# Patient Record
Sex: Male | Born: 1935 | Race: White | Hispanic: No | Marital: Married | Smoking: Never smoker
Health system: Southern US, Community
[De-identification: ages and names within clinical notes are randomized; demographics above are authoritative.]

## PROBLEM LIST (undated history)

## (undated) DIAGNOSIS — E079 Disorder of thyroid, unspecified: Secondary | ICD-10-CM

## (undated) DIAGNOSIS — R0989 Other specified symptoms and signs involving the circulatory and respiratory systems: Secondary | ICD-10-CM

## (undated) DIAGNOSIS — K219 Gastro-esophageal reflux disease without esophagitis: Secondary | ICD-10-CM

## (undated) DIAGNOSIS — R609 Edema, unspecified: Secondary | ICD-10-CM

## (undated) DIAGNOSIS — F419 Anxiety disorder, unspecified: Secondary | ICD-10-CM

## (undated) DIAGNOSIS — C801 Malignant (primary) neoplasm, unspecified: Secondary | ICD-10-CM

## (undated) DIAGNOSIS — R413 Other amnesia: Secondary | ICD-10-CM

## (undated) DIAGNOSIS — R21 Rash and other nonspecific skin eruption: Secondary | ICD-10-CM

## (undated) DIAGNOSIS — I10 Essential (primary) hypertension: Secondary | ICD-10-CM

## (undated) HISTORY — DX: Other specified symptoms and signs involving the circulatory and respiratory systems: R09.89

## (undated) HISTORY — PX: TONSILLECTOMY: SUR1361

## (undated) HISTORY — PX: PILONIDAL CYST EXCISION: SHX744

## (undated) HISTORY — DX: Other amnesia: R41.3

## (undated) HISTORY — DX: Malignant (primary) neoplasm, unspecified: C80.1

## (undated) HISTORY — DX: Edema, unspecified: R60.9

## (undated) HISTORY — DX: Anxiety disorder, unspecified: F41.9

## (undated) HISTORY — DX: Rash and other nonspecific skin eruption: R21

---

## 2002-05-08 ENCOUNTER — Encounter: Payer: Self-pay | Admitting: Neurological Surgery

## 2002-05-08 ENCOUNTER — Ambulatory Visit (HOSPITAL_COMMUNITY): Admission: RE | Admit: 2002-05-08 | Discharge: 2002-05-08 | Payer: Self-pay

## 2007-10-10 ENCOUNTER — Encounter (INDEPENDENT_AMBULATORY_CARE_PROVIDER_SITE_OTHER): Payer: Self-pay | Admitting: Interventional Radiology

## 2007-10-10 ENCOUNTER — Other Ambulatory Visit: Admission: RE | Admit: 2007-10-10 | Discharge: 2007-10-10 | Payer: Self-pay | Admitting: Interventional Radiology

## 2007-10-10 ENCOUNTER — Encounter: Admission: RE | Admit: 2007-10-10 | Discharge: 2007-10-10 | Payer: Self-pay | Admitting: Surgery

## 2009-09-21 ENCOUNTER — Encounter: Admission: RE | Admit: 2009-09-21 | Discharge: 2009-09-21 | Payer: Self-pay | Admitting: Surgery

## 2010-11-08 ENCOUNTER — Encounter
Admission: RE | Admit: 2010-11-08 | Discharge: 2010-11-08 | Payer: Self-pay | Source: Home / Self Care | Attending: Surgery | Admitting: Surgery

## 2012-11-18 ENCOUNTER — Other Ambulatory Visit (INDEPENDENT_AMBULATORY_CARE_PROVIDER_SITE_OTHER): Payer: Self-pay

## 2012-11-18 ENCOUNTER — Telehealth (INDEPENDENT_AMBULATORY_CARE_PROVIDER_SITE_OTHER): Payer: Self-pay

## 2012-11-18 DIAGNOSIS — E042 Nontoxic multinodular goiter: Secondary | ICD-10-CM

## 2012-11-18 NOTE — Telephone Encounter (Signed)
I returned call. Pt will set up u/s at [facility]. Order for u/s and labs placed in epic. Lab slip mailed to pt. Pts wife states pt was not able to have thyroid followed at [place]. They advised him they no longer follow thyroid issues and pt will need to be seen by thyroid specialist.

## 2012-11-27 ENCOUNTER — Other Ambulatory Visit (INDEPENDENT_AMBULATORY_CARE_PROVIDER_SITE_OTHER): Payer: Self-pay | Admitting: Surgery

## 2012-11-27 ENCOUNTER — Ambulatory Visit
Admission: RE | Admit: 2012-11-27 | Discharge: 2012-11-27 | Disposition: A | Discharge status: Home / Self Care | Payer: Medicare Other | Source: Ambulatory Visit | Attending: Surgery | Admitting: Surgery

## 2012-11-27 DIAGNOSIS — E042 Nontoxic multinodular goiter: Secondary | ICD-10-CM

## 2012-12-03 ENCOUNTER — Ambulatory Visit (INDEPENDENT_AMBULATORY_CARE_PROVIDER_SITE_OTHER): Payer: Self-pay | Admitting: Surgery

## 2012-12-23 ENCOUNTER — Emergency Department (HOSPITAL_COMMUNITY)
Admission: EM | Admit: 2012-12-23 | Discharge: 2012-12-23 | Disposition: A | Discharge status: Home / Self Care | Payer: Medicare Other | Attending: Emergency Medicine | Admitting: Emergency Medicine

## 2012-12-23 ENCOUNTER — Emergency Department (HOSPITAL_COMMUNITY): Payer: Medicare Other

## 2012-12-23 ENCOUNTER — Encounter (HOSPITAL_COMMUNITY): Payer: Self-pay | Admitting: Emergency Medicine

## 2012-12-23 DIAGNOSIS — Z7982 Long term (current) use of aspirin: Secondary | ICD-10-CM | POA: Insufficient documentation

## 2012-12-23 DIAGNOSIS — I10 Essential (primary) hypertension: Secondary | ICD-10-CM | POA: Insufficient documentation

## 2012-12-23 DIAGNOSIS — F068 Other specified mental disorders due to known physiological condition: Secondary | ICD-10-CM | POA: Insufficient documentation

## 2012-12-23 DIAGNOSIS — N200 Calculus of kidney: Secondary | ICD-10-CM | POA: Insufficient documentation

## 2012-12-23 DIAGNOSIS — E079 Disorder of thyroid, unspecified: Secondary | ICD-10-CM | POA: Insufficient documentation

## 2012-12-23 DIAGNOSIS — K219 Gastro-esophageal reflux disease without esophagitis: Secondary | ICD-10-CM | POA: Insufficient documentation

## 2012-12-23 DIAGNOSIS — Z79899 Other long term (current) drug therapy: Secondary | ICD-10-CM | POA: Insufficient documentation

## 2012-12-23 HISTORY — DX: Gastro-esophageal reflux disease without esophagitis: K21.9

## 2012-12-23 HISTORY — DX: Essential (primary) hypertension: I10

## 2012-12-23 HISTORY — DX: Disorder of thyroid, unspecified: E07.9

## 2012-12-23 LAB — CBC WITH DIFFERENTIAL/PLATELET
Basophils Absolute: 0 10*3/uL (ref 0.0–0.1)
Eosinophils Absolute: 0 10*3/uL (ref 0.0–0.7)
Eosinophils Relative: 0 % (ref 0–5)
MCH: 31.9 pg (ref 26.0–34.0)
MCHC: 34.6 g/dL (ref 30.0–36.0)
MCV: 92.2 fL (ref 78.0–100.0)
Platelets: 126 10*3/uL — ABNORMAL LOW (ref 150–400)
RDW: 12.3 % (ref 11.5–15.5)

## 2012-12-23 LAB — COMPREHENSIVE METABOLIC PANEL
ALT: 20 U/L (ref 0–53)
AST: 28 U/L (ref 0–37)
Calcium: 9.6 mg/dL (ref 8.4–10.5)
Creatinine, Ser: 1.17 mg/dL (ref 0.50–1.35)
GFR calc Af Amer: 63 mL/min — ABNORMAL LOW (ref >90)
Sodium: 140 mEq/L (ref 135–145)
Total Protein: 7.1 g/dL (ref 6.0–8.3)

## 2012-12-23 LAB — URINALYSIS, ROUTINE W REFLEX MICROSCOPIC
Bilirubin Urine: NEGATIVE
Hgb urine dipstick: NEGATIVE
Ketones, ur: NEGATIVE mg/dL
Protein, ur: NEGATIVE mg/dL
Urobilinogen, UA: 1 mg/dL (ref 0.0–1.0)

## 2012-12-23 LAB — LIPASE, BLOOD: Lipase: 20 U/L (ref 11–59)

## 2012-12-23 MED ORDER — HYDROCODONE-ACETAMINOPHEN 5-325 MG PO TABS: 1.0000 | ORAL_TABLET | Freq: Four times a day (QID) | ORAL | Status: DC | PRN

## 2012-12-23 MED ORDER — MORPHINE SULFATE 4 MG/ML IJ SOLN
4.0000 mg | Freq: Once | INTRAMUSCULAR | Status: AC
  Administered 2012-12-23: 4 mg via INTRAVENOUS
  Filled 2012-12-23: qty 1

## 2012-12-23 MED ORDER — IOHEXOL 300 MG/ML  SOLN
100.0000 mL | Freq: Once | INTRAMUSCULAR | Status: AC | PRN
  Administered 2012-12-23: 100 mL via INTRAVENOUS

## 2012-12-23 MED ORDER — IOHEXOL 300 MG/ML  SOLN
25.0000 mL | INTRAMUSCULAR | Status: AC
  Administered 2012-12-23 (×2): 25 mL via ORAL

## 2012-12-23 MED ORDER — SODIUM CHLORIDE 0.9 % IV SOLN
1000.0000 mL | Freq: Once | INTRAVENOUS | Status: AC
  Administered 2012-12-23: 1000 mL via INTRAVENOUS

## 2012-12-23 MED ORDER — SODIUM CHLORIDE 0.9 % IV SOLN
1000.0000 mL | INTRAVENOUS | Status: DC
  Administered 2012-12-23: 1000 mL via INTRAVENOUS

## 2012-12-23 NOTE — ED Provider Notes (Signed)
History     CSN: [id number]  Arrival date & time 12/23/12  1232   First MD Initiated Contact with Patient 12/23/12 1336      No chief complaint on file.   (Consider location/radiation/quality/duration/timing/severity/associated sxs/prior treatment) HPI Comments: He had an episode two days ago but it was just for a brief time and then he was fine.  This am at 7 am he started complaining of pain in his lower abdomen.  He started yelling help me.  History is limited by the patient's dementia.  He denies pain now and does not know why he is here.  Patient is a 79 y.o. male presenting with abdominal pain. The history is provided by the patient and a relative.  Abdominal Pain Pain radiates to:  LLQ Pain severity:  Severe Onset quality:  Sudden Timing:  Sporadic Context: not trauma   Relieved by:  Nothing Worsened by:  Nothing tried Associated symptoms: no anorexia, no constipation, no nausea and no vomiting   Risk factors comment:  History of prior kidney stones   Past Medical History  Diagnosis Date  . Hypertension   . Thyroid disease   . GERD (gastroesophageal reflux disease)     No past surgical history on file. No prior surgeries  No family history on file.  History  Substance Use Topics  . Smoking status: Not on file  . Smokeless tobacco: Not on file  . Alcohol Use: Not on file      Review of Systems  Gastrointestinal: Positive for abdominal pain. Negative for nausea, vomiting, constipation and anorexia.  All other systems reviewed and are negative.    Allergies  Betadine; Clindamycin/lincomycin; and Penicillins  Home Medications   Current Outpatient Rx  Name  Route  Sig  Dispense  Refill  . amLODipine (NORVASC) 10 MG tablet   Oral   Take 10 mg by mouth daily.         Marland Kitchen aspirin EC 81 MG tablet   Oral   Take 81 mg by mouth daily.         . beta carotene w/minerals (OCUVITE) tablet   Oral   Take 1 tablet by mouth daily.         Marland Kitchen  levothyroxine (SYNTHROID, LEVOTHROID) 88 MCG tablet   Oral   Take 88 mcg by mouth daily.         Marland Kitchen omeprazole (PRILOSEC) 20 MG capsule   Oral   Take 20 mg by mouth daily.           BP 130/52  Pulse 60  Temp(Src) 97.7 F (36.5 C) (Oral)  SpO2 95%  Physical Exam  Nursing note and vitals reviewed. Constitutional: He appears well-developed and well-nourished. No distress.  HENT:  Head: Normocephalic and atraumatic.  Right Ear: External ear normal.  Left Ear: External ear normal.  Eyes: Conjunctivae are normal. Right eye exhibits no discharge. Left eye exhibits no discharge. No scleral icterus.  Neck: Neck supple. No tracheal deviation present.  Cardiovascular: Normal rate, regular rhythm and intact distal pulses.   Pulmonary/Chest: Effort normal and breath sounds normal. No stridor. No respiratory distress. He has no wheezes. He has no rales.  Abdominal: Soft. Bowel sounds are normal. He exhibits no distension. There is no tenderness. There is no rebound, no guarding and no CVA tenderness.  Musculoskeletal: He exhibits no edema and no tenderness.  Neurological: He is alert. He has normal strength. No sensory deficit. Cranial nerve deficit:  no gross defecits noted. He  exhibits normal muscle tone. He displays no seizure activity. Coordination normal.  Skin: Skin is warm and dry. No rash noted.  Psychiatric: He has a normal mood and affect.    ED Course  Procedures (including critical care time)  Labs Reviewed  CBC WITH DIFFERENTIAL - Abnormal; Notable for the following:    Platelets 126 (*)    Neutrophils Relative 84 (*)    Neutro Abs 8.2 (*)    Lymphocytes Relative 5 (*)    Lymphs Abs 0.5 (*)    All other components within normal limits  COMPREHENSIVE METABOLIC PANEL - Abnormal; Notable for the following:    Glucose, Bld 109 (*)    GFR calc non Af Amer 55 (*)    GFR calc Af Amer 63 (*)    All other components within normal limits  URINALYSIS, ROUTINE W REFLEX  MICROSCOPIC  LIPASE, BLOOD   Ct Abdomen Pelvis W Contrast  12/23/2012  *RADIOLOGY REPORT*  Clinical Data: Intermittent left lower quadrant abdominal pain. Dementia.  CT ABDOMEN AND PELVIS WITH CONTRAST  Technique:  Multidetector CT imaging of the abdomen and pelvis was performed following the standard protocol during bolus administration of intravenous contrast.  Contrast: OMNIPAQUE IOHEXOL 300 MG/ML  SOLN  Comparison: Abdominal radiographs 10/26/2011.  CT urogram 10/24/2011.  Findings: Mild atelectasis has developed at both lung bases.  There is no confluent airspace opacity or significant pleural effusion.  No significant abnormalities of the liver, gallbladder, pancreas, spleen or adrenal glands are seen.  The right kidney appears stable with mild cortical thinning.  Small low-density left renal lesions are likely cysts.  There is chronic left-sided hydronephrosis with delayed contrast excretion.  Within the distal left ureter is a 5 mm calculus on image 79, within 2 cm of the ureteral vesicle junction.  This may represent the same calculus that was previously demonstrated within the mid-left ureter.  There is no evidence of right ureteral or bladder calculus. The bladder is mildly distended.  Stool is present throughout the colon.  There are scattered mild diverticular changes without surrounding inflammation.  The prostate gland is moderately enlarged. Aorto iliac atherosclerosis appears stable.  Degenerative changes are present throughout the spine associated with a scoliosis.  No acute or worrisome osseous findings are identified.  IMPRESSION:  1.  Obstructing distal left ureteral calculus, potentially the same calculus previously demonstrated more proximally in the left ureter.  Correlate clinically.  If this is the same calculus, and underlying ureteral stricture would be of concern.  The degree of hydronephrosis is similar to the prior study. 2.  No other acute findings identified.  Stable  sigmoid colon diverticulosis.   Original Report Authenticated By: Carey Bullocks, M.D.      1. Kidney stone       MDM  The patient's CT scan shows and obstructing distal left ureteral calculus. I suspect this is the source of his pain. The patient is currently comfortable. I discussed the findings with the patient and his family. I have a urologist , Dr Saddie Benders.  Instructed them to follow up with him.  The family is comfortable with this plan.        Celene Kras, MD 12/23/12 [phone]

## 2012-12-23 NOTE — ED Notes (Signed)
Left sided abd pain got up with this am may have had it a night of two agoa also

## 2012-12-24 ENCOUNTER — Ambulatory Visit (INDEPENDENT_AMBULATORY_CARE_PROVIDER_SITE_OTHER): Payer: MEDICARE | Admitting: Surgery

## 2012-12-24 ENCOUNTER — Encounter (INDEPENDENT_AMBULATORY_CARE_PROVIDER_SITE_OTHER): Payer: Self-pay | Admitting: Surgery

## 2012-12-24 VITALS — BP 136/64 | HR 76 | Temp 97.3°F | Resp 16 | Ht 69.0 in | Wt 211.6 lb

## 2012-12-24 DIAGNOSIS — E039 Hypothyroidism, unspecified: Secondary | ICD-10-CM | POA: Insufficient documentation

## 2012-12-24 DIAGNOSIS — E041 Nontoxic single thyroid nodule: Secondary | ICD-10-CM

## 2012-12-24 NOTE — Progress Notes (Signed)
[facility]  Visit Diagnoses: 1. Thyroid nodule, uninodular, left 1.3cm   2. Hypothyroidism     HISTORY: Patient is an 79 year old white male followed for left thyroid nodule. It has been over 2 years since his last office visit. He continues to take levothyroxine 88 mcg daily. His most recent TSH level was normal at 3.015. Her graft patient underwent thyroid ultrasound in January 2014. This shows a normal sized thyroid gland which is mildly heterogeneous. There is a solitary nodule in the left upper pole measuring 1.3 cm. This has decreased slightly in size since his previous study in January 2012 when the nodule measured 1.6 cm. No lymphadenopathy was identified.  PERTINENT REVIEW OF SYSTEMS: Denies tremor. Denies palpitation. Denies compressive symptoms. Denies pain.  EXAM: HEENT: normocephalic; pupils equal and reactive; sclerae clear; dentition good; mucous membranes moist NECK:  No palpable nodules bilaterally; symmetric on extension; no palpable anterior or posterior cervical lymphadenopathy; no supraclavicular masses; no tenderness CHEST: clear to auscultation bilaterally without rales, rhonchi, or wheezes CARDIAC: regular rate and rhythm without significant murmur; peripheral pulses are full EXT:  non-tender without edema; no deformity NEURO: no gross focal deficits; no sign of tremor   IMPRESSION: #1 Left thyroid nodule, 1.3 cm, stable #2 hypothyroidism  PLAN: I discussed the above findings with the patient and his wife. He does not need further surgical follow-up at this point. He does need to stay on levothyroxine 88 mcg daily. He should have a TSH level checked once a year.  Patient will return for surgical evaluation as needed.  Velora Heckler, MD, [facility] Office: [phone]

## 2013-08-16 IMAGING — CT CT ABD-PELV W/ CM
2 of 5 series · 17 of 46 positions shown, 19 images · IV contrast (omnipaque)
Comparison: Abdominal radiographs 10/26/2011.  CT urogram
10/24/2011.

CLINICAL DATA: Intermittent left lower quadrant abdominal pain.
Dementia.

CT ABDOMEN AND PELVIS WITH CONTRAST
TECHNIQUE: Multidetector CT imaging of the abdomen and pelvis was
performed following the standard protocol during bolus
administration of intravenous contrast.
Contrast: 100mL OMNIPAQUE IOHEXOL 300 MG/ML  SOLN

[Series 2: abd/pelv with 5.0 b31f st · axial · 0.92mm/px · z∈[-444,+10]mm · 14 of 103 slices shown, 16 images]
[im 6/103  soft-tissue]
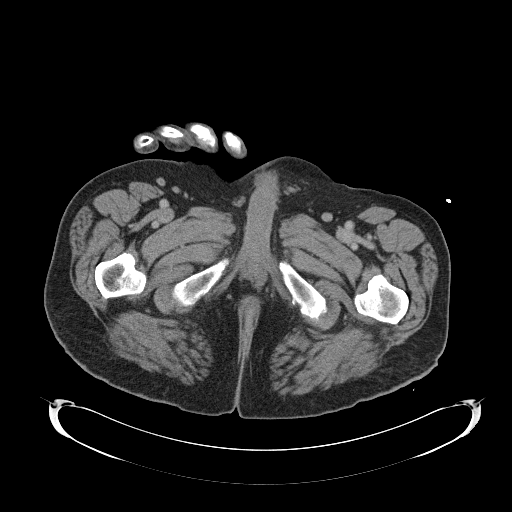
[im 6/103  bone]
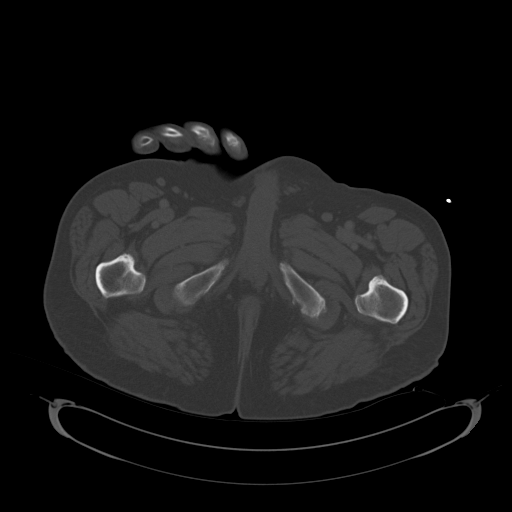
[im 12/103  soft-tissue]
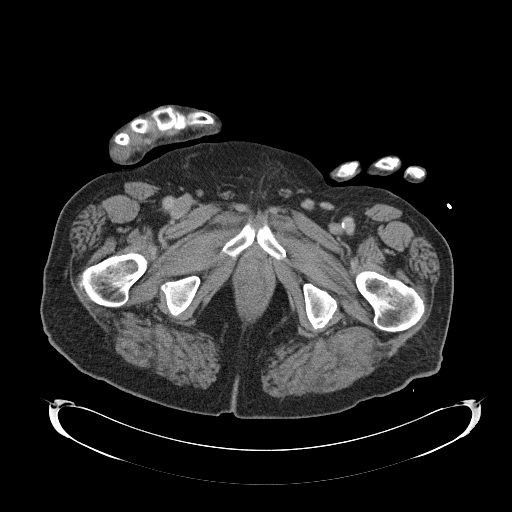
[im 23/103  soft-tissue]
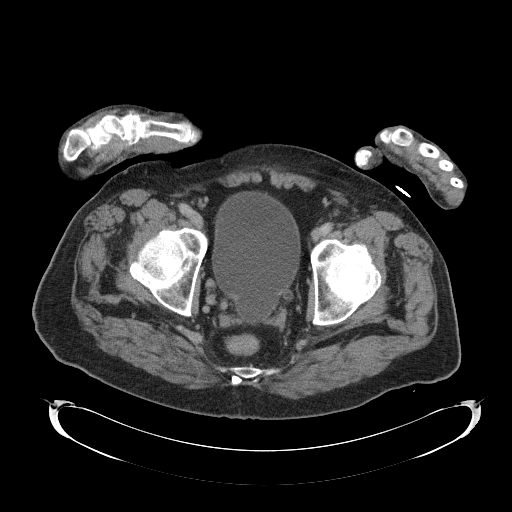
[im 29/103  soft-tissue]
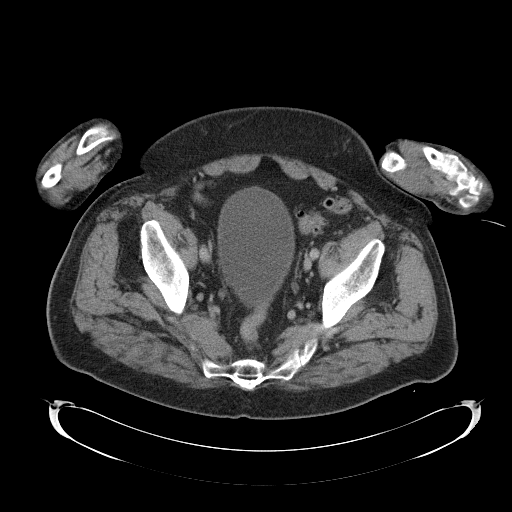
[im 35/103  soft-tissue]
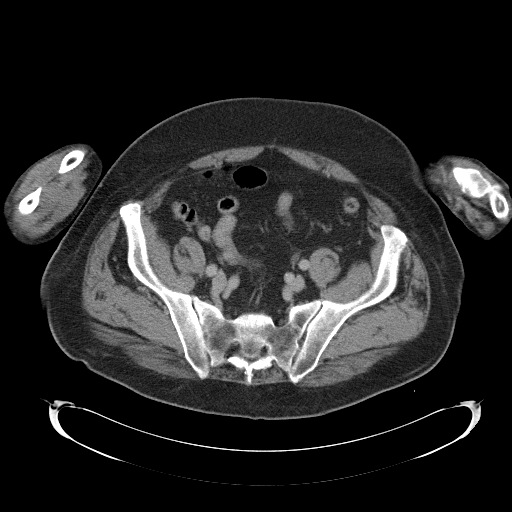
[im 40/103  soft-tissue]
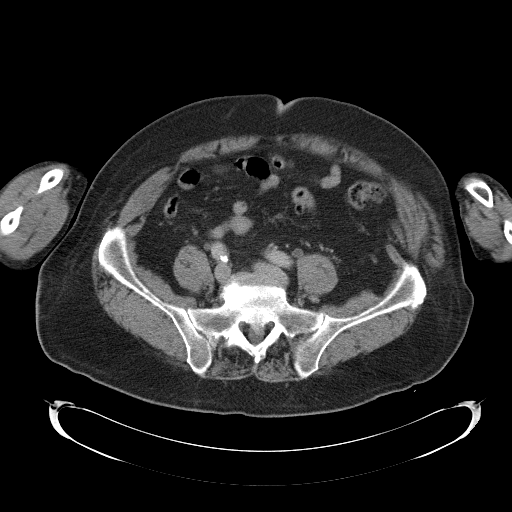
[im 46/103  soft-tissue]
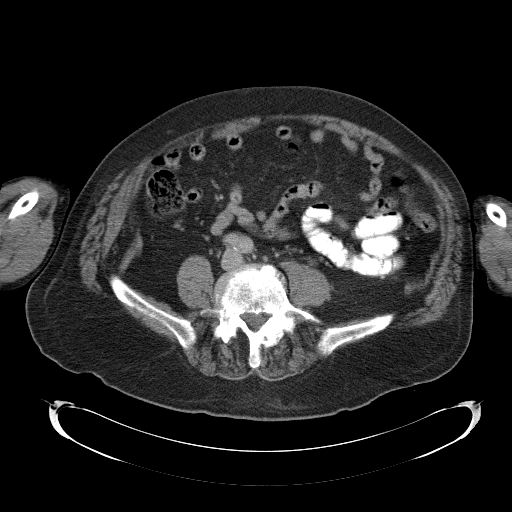
[im 57/103  soft-tissue]
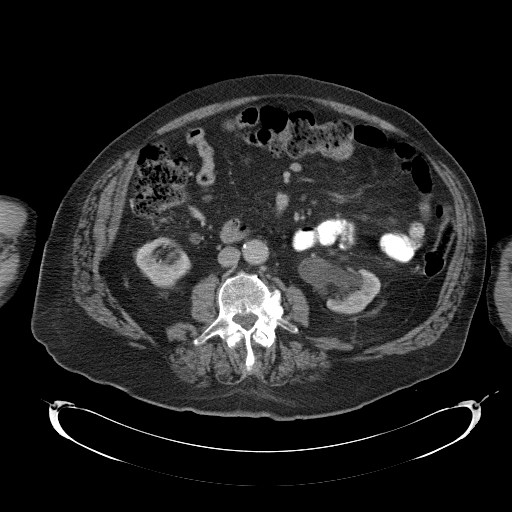
[im 63/103  soft-tissue]
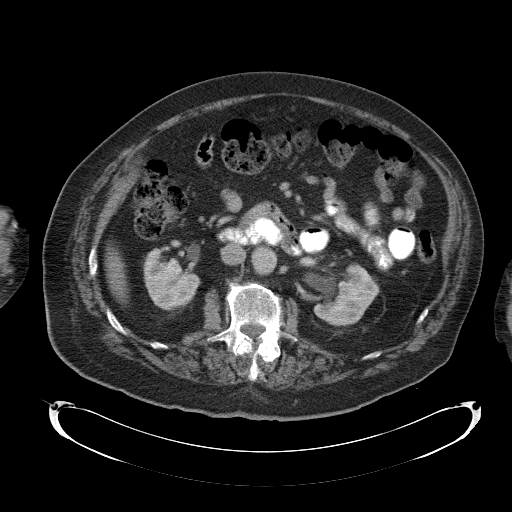
[im 63/103  bone]
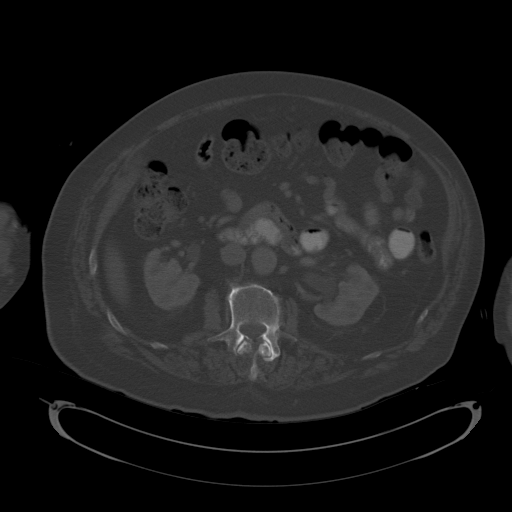
[im 69/103  soft-tissue]
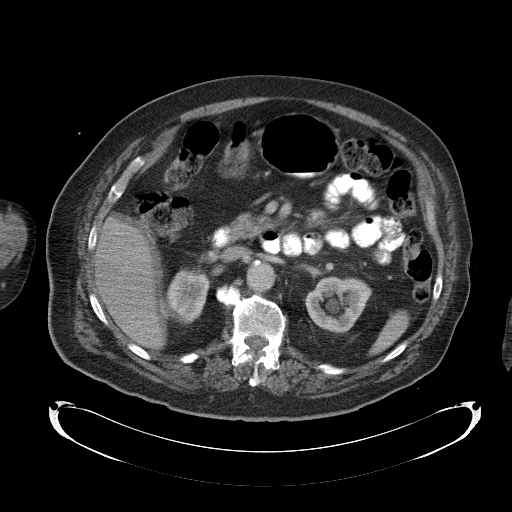
[im 74/103  soft-tissue]
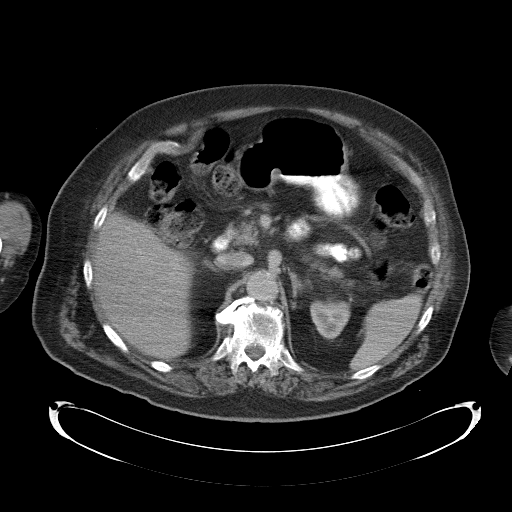
[im 80/103  soft-tissue]
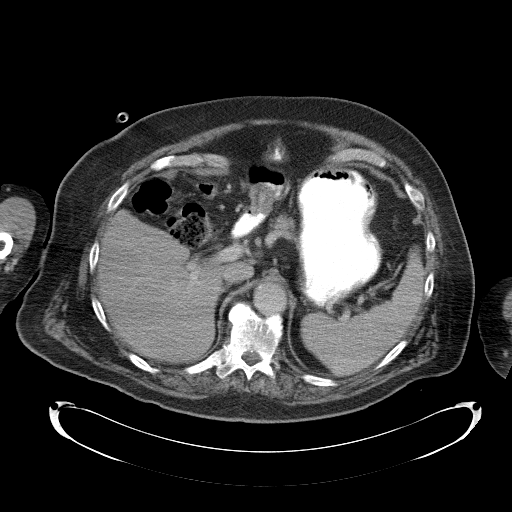
[im 91/103  soft-tissue]
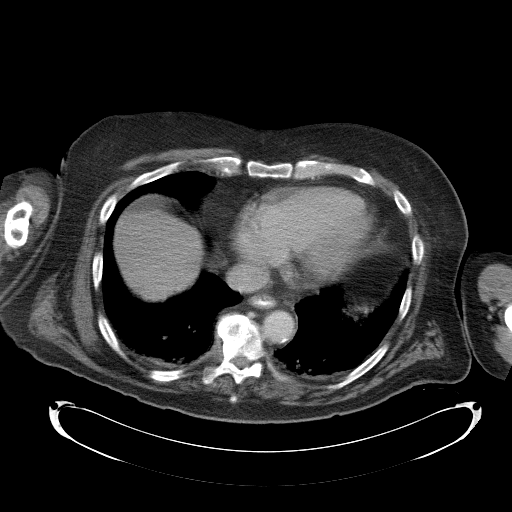
[im 97/103  soft-tissue]
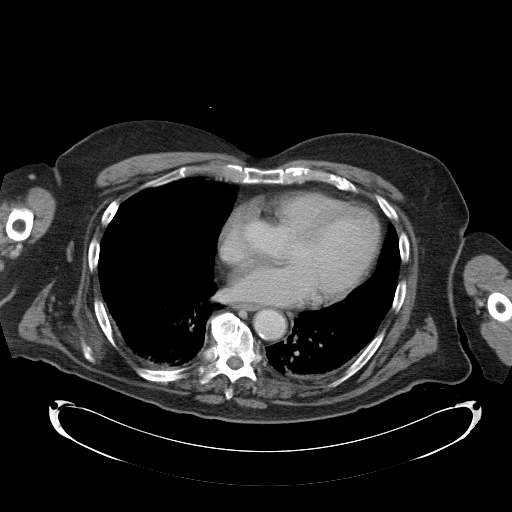

[Series 5: coronals · coronal · 1.00mm/px · 3 of 93 slices shown]
[im 31/93  soft-tissue]
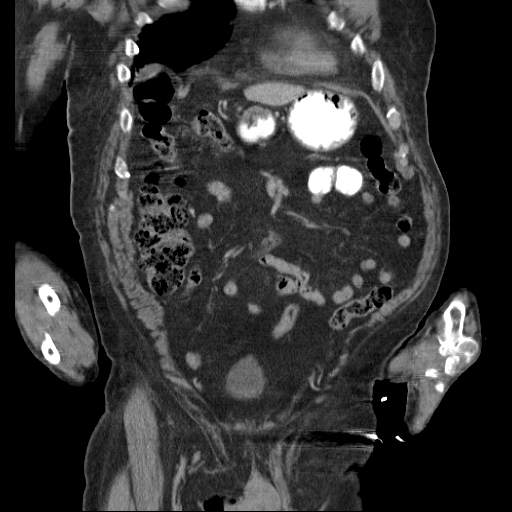
[im 41/93  soft-tissue]
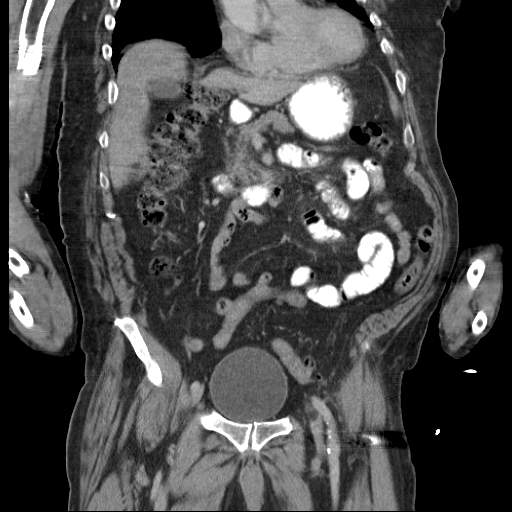
[im 52/93  soft-tissue]
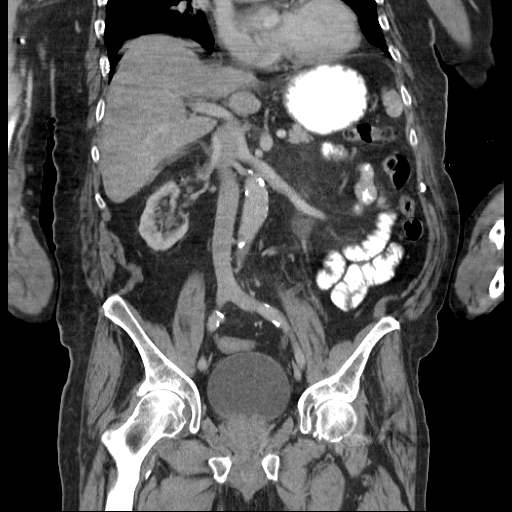

[17 of 46 positions shown; findings below may reference images not displayed]

FINDINGS: Mild atelectasis has developed at both lung bases.  There
is no confluent airspace opacity or significant pleural effusion.

No significant abnormalities of the liver, gallbladder, pancreas,
spleen or adrenal glands are seen.

The right kidney appears stable with mild cortical thinning.  Small
low-density left renal lesions are likely cysts.  There is chronic
left-sided hydronephrosis with delayed contrast excretion.  Within
the distal left ureter is a 5 mm calculus on image 79, within 2 cm
of the ureteral vesicle junction.  This may represent the same
calculus that was previously demonstrated within the mid-left
ureter.  There is no evidence of right ureteral or bladder
calculus. The bladder is mildly distended.

Stool is present throughout the colon.  There are scattered mild
diverticular changes without surrounding inflammation.  The
prostate gland is moderately enlarged. Aorto iliac atherosclerosis
appears stable.

Degenerative changes are present throughout the spine associated
with a scoliosis.  No acute or worrisome osseous findings are
identified.
IMPRESSION: 1.  Obstructing distal left ureteral calculus, potentially the same
calculus previously demonstrated more proximally in the left
ureter.  Correlate clinically.  If this is the same calculus, and
underlying ureteral stricture would be of concern.  The degree of
hydronephrosis is similar to the prior study.
2.  No other acute findings identified.  Stable sigmoid colon
diverticulosis.

## 2014-06-02 ENCOUNTER — Encounter: Payer: Self-pay | Admitting: Podiatrist

## 2014-06-02 ENCOUNTER — Ambulatory Visit (INDEPENDENT_AMBULATORY_CARE_PROVIDER_SITE_OTHER): Payer: Medicare Other | Admitting: Podiatrist

## 2014-06-02 DIAGNOSIS — M79676 Pain in unspecified toe(s): Principal | ICD-10-CM

## 2014-06-02 DIAGNOSIS — B351 Tinea unguium: Secondary | ICD-10-CM

## 2014-06-02 DIAGNOSIS — M79609 Pain in unspecified limb: Secondary | ICD-10-CM

## 2014-06-02 NOTE — Progress Notes (Signed)
   Subjective:    Patient ID: James Dalton, male    DOB: [birth year], 80 y.o.   MRN: [id number]  HPI WIFE STATES THAT HE NEEDS HIS NAILS CUT AND THAT HE HAS DEMENTIA    Review of Systems  Eyes: Positive for itching.  Cardiovascular: Positive for leg swelling.  Musculoskeletal: Positive for gait problem.  Skin: Positive for color change and rash.       OPEN SORES   Hematological: Bruises/bleeds easily.       SLOW TO HEAL  Psychiatric/Behavioral: Positive for confusion.  All other systems reviewed and are negative.      Objective:   Physical Exam Vascular status is intact with palpable pedal pulses are 1/4 DP and PT bilateral. Capillary refill time is within normal limits. Normal proximal to distal cooling is noted. Neurological sensation is intact via light touch. Difficult to ascertain Semmes Weinstein monofilament due to inability to communicate or follow instructions. Musculoskeletal examination reveals intact muscle, strength, tone and stability. Patients toenails are elongated, thickened, discolored, dystrophic. He has slight irritation around the toenails of the right foot due to the significant thickening of the nails right foot. No open lesions are seen no sign of infection is noted.     Assessment & Plan:  Symptomatic mycotic toenails,  Plan: Debridement of the toenails is accomplished today without complication. I will see him back in 3 months or as needed for followup.

## 2014-06-02 NOTE — Patient Instructions (Signed)

## 2015-01-05 DEATH — deceased
